# Patient Record
Sex: Male | Born: 1955 | Race: White | Marital: Married
Health system: Southern US, Community
[De-identification: ages and names within clinical notes are randomized; demographics above are authoritative.]

---

## 2018-03-24 ENCOUNTER — Telehealth: Payer: Self-pay | Admitting: Nurse Practitioner

## 2018-03-24 ENCOUNTER — Other Ambulatory Visit: Payer: Self-pay | Admitting: Rehabilitation

## 2018-03-24 DIAGNOSIS — M4322 Fusion of spine, cervical region: Secondary | ICD-10-CM

## 2018-03-24 DIAGNOSIS — M545 Low back pain: Secondary | ICD-10-CM

## 2018-03-24 DIAGNOSIS — G8929 Other chronic pain: Secondary | ICD-10-CM

## 2018-03-24 NOTE — Telephone Encounter (Signed)
Phone call to patient to verify medication list and allergies for myelogram procedure. Pt instructed to stop taking tramadol 48 hrs prior to myelogram appointment time. Pt verbalized understanding.

## 2018-04-07 ENCOUNTER — Ambulatory Visit
Admission: RE | Admit: 2018-04-07 | Discharge: 2018-04-07 | Disposition: A | Discharge status: Home / Self Care | Payer: BLUE CROSS/BLUE SHIELD | Source: Ambulatory Visit | Attending: Rehabilitation | Admitting: Rehabilitation

## 2018-04-07 DIAGNOSIS — M4322 Fusion of spine, cervical region: Secondary | ICD-10-CM

## 2018-04-07 DIAGNOSIS — M545 Low back pain: Secondary | ICD-10-CM

## 2018-04-07 DIAGNOSIS — G8929 Other chronic pain: Secondary | ICD-10-CM

## 2018-04-07 MED ORDER — DIAZEPAM 5 MG PO TABS
10.0000 mg | ORAL_TABLET | Freq: Once | ORAL | Status: AC
  Administered 2018-04-07: 10 mg via ORAL

## 2018-04-07 MED ORDER — IOPAMIDOL (ISOVUE-M 300) INJECTION 61%
10.0000 mL | Freq: Once | INTRAMUSCULAR | Status: AC | PRN
  Administered 2018-04-07: 10 mL via INTRATHECAL

## 2018-04-07 MED ORDER — ONDANSETRON HCL 4 MG/2ML IJ SOLN: 4.0000 mg | Freq: Four times a day (QID) | INTRAMUSCULAR | Status: DC | PRN

## 2018-04-07 NOTE — Discharge Instructions (Signed)
Myelogram Discharge Instructions  1. Go home and rest quietly for the next 24 hours.  It is important to lie flat for the next 24 hours.  Get up only to go to the restroom.  You may lie in the bed or on a couch on your back, your stomach, your left side or your right side.  You may have one pillow under your head.  You may have pillows between your knees while you are on your side or under your knees while you are on your back.  2. DO NOT drive today.  Recline the seat as far back as it will go, while still wearing your seat belt, on the way home.  3. You may get up to go to the bathroom as needed.  You may sit up for 10 minutes to eat.  You may resume your normal diet and medications unless otherwise indicated.  Drink lots of extra fluids today and tomorrow.  4. The incidence of headache, nausea, or vomiting is about 5% (one in 20 patients).  If you develop a headache, lie flat and drink plenty of fluids until the headache goes away.  Caffeinated beverages may be helpful.  If you develop severe nausea and vomiting or a headache that does not go away with flat bed rest, call [phone].  5. You may resume normal activities after your 24 hours of bed rest is over; however, do not exert yourself strongly or do any heavy lifting tomorrow. If when you get up you have a headache when standing, go back to bed and force fluids for another 24 hours.  6. Call your physician for a follow-up appointment.  The results of your myelogram will be sent directly to your physician by the following day.  7. If you have any questions or if complications develop after you arrive home, please call [phone].  Discharge instructions have been explained to the patient.  The patient, or the person responsible for the patient, fully understands these instructions.   YOU MAY RESTART YOUR FLEXERIL AND TRAMADOL TOMORROW 04/08/2018 AT 09:30AM.

## 2018-12-26 IMAGING — CT CT CERVICAL SPINE W/ CM
3 series · 8 of 14 positions shown, 9 images · non-contrast
Comparison: none

CLINICAL DATA: Bilateral leg weakness and numbness. Bilateral arm
weakness with muscle wasting.
TECHNIQUE: Contiguous axial images were obtained through the Cervical and
Lumbar spine after the intrathecal infusion of infusion. Coronal and
sagittal reconstructions were obtained of the axial image sets.

[Series 3: cspine soft · axial · 0.35mm/px · z∈[-224,-138]mm · 2 of 131 slices shown]
[im 44/131  soft-tissue]
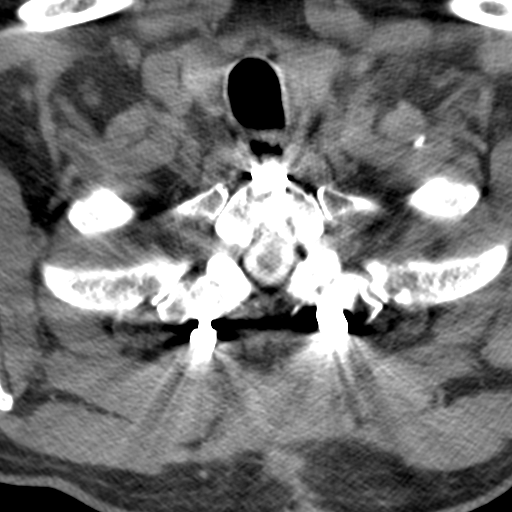
[im 87/131  soft-tissue]
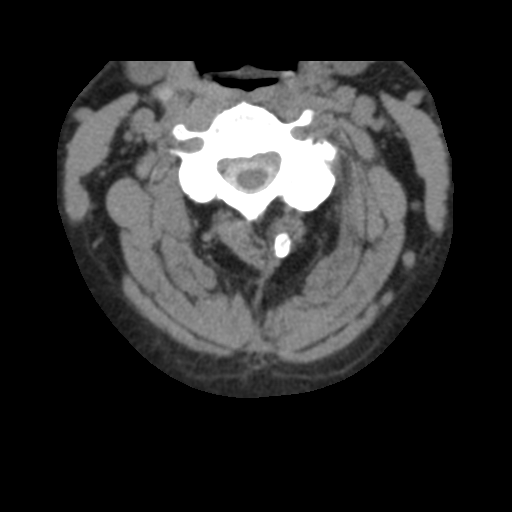

[Series 4: cspine soft (person_name) · axial · 0.35mm/px · z∈[-246,-116]mm · 3 of 131 slices shown]
[im 33/131  soft-tissue]
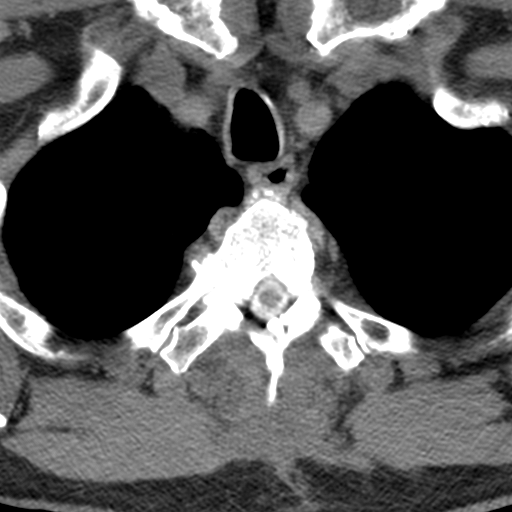
[im 66/131  soft-tissue]
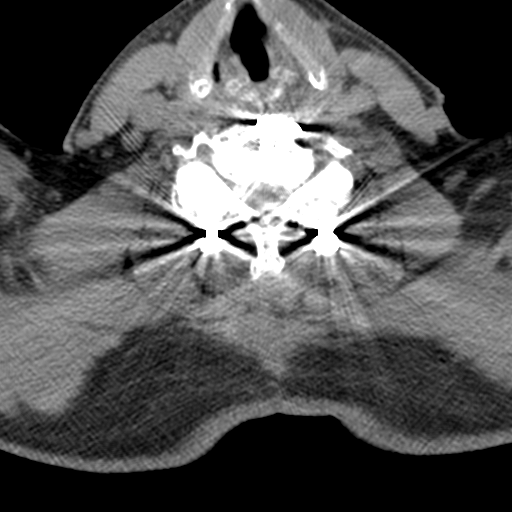
[im 98/131  soft-tissue]
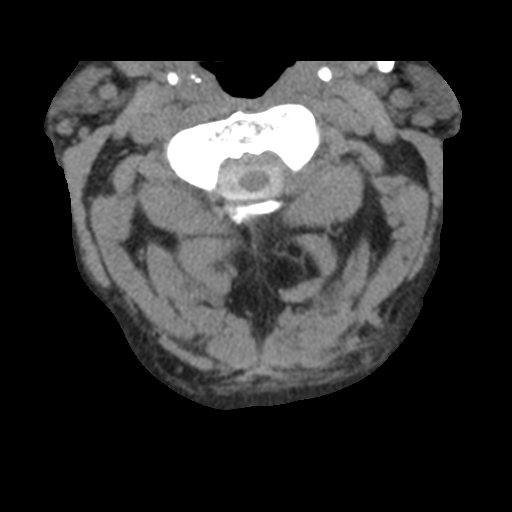

[Series 10: angled axial · axial · 0.29mm/px · z∈[-264,-135]mm · 3 of 132 slices shown, 4 images]
[im 33/132  soft-tissue]
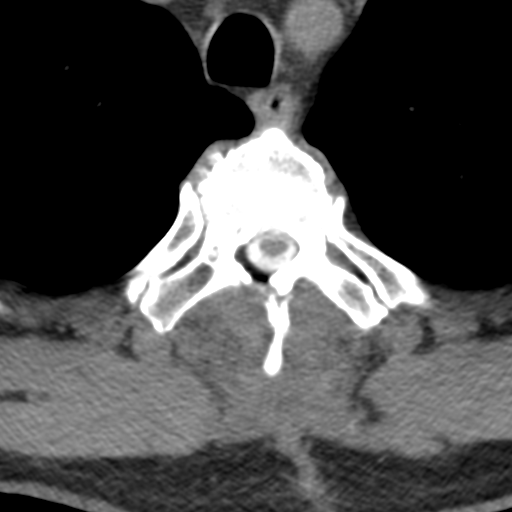
[im 33/132  bone]
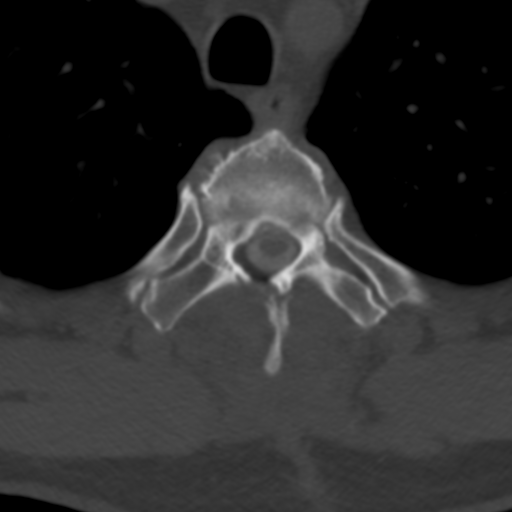
[im 66/132  bone]
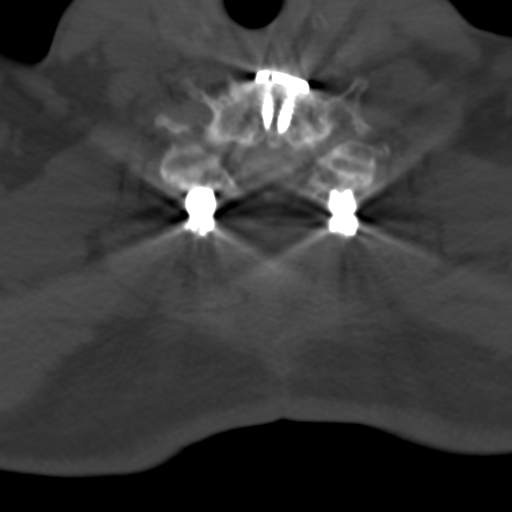
[im 99/132  bone]
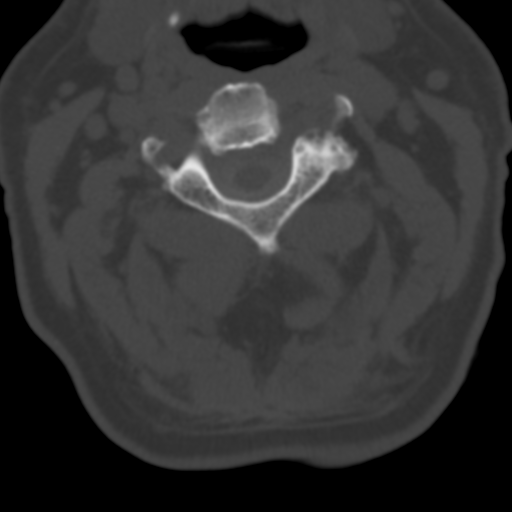

[8 of 14 positions shown; findings below may reference images not displayed]

FLUOROSCOPY TIME:  1 minutes 44 seconds. 577.80 micro gray meter
squared

PROCEDURE:
LUMBAR PUNCTURE FOR CERVICAL AND LUMBAR MYELOGRAM

CERVICAL AND LUMBAR MYELOGRAM

CT CERVICAL MYELOGRAM

CT LUMBAR MYELOGRAM

After thorough discussion of risks and benefits of the procedure
including bleeding, infection, injury to nerves, blood vessels,
adjacent structures as well as headache and CSF leak, written and
oral informed consent was obtained. Consent was obtained by Dr. Franca
Lenis.

Patient was positioned prone on the fluoroscopy table. Local
anesthesia was provided with 1% lidocaine without epinephrine after
prepped and draped in the usual sterile fashion. Puncture was
performed at L1-2 using a 3 1/2 inch 22-gauge spinal needle via
right para median approach. Using a single pass through the dura,
the needle was placed within the thecal sac, with return of clear
CSF. 10 mL of Isovue B-LAA was injected into the thecal sac, with
normal opacification of the nerve roots and cauda equina consistent
with free flow within the subarachnoid space. The patient was then
moved to the trendelenburg position and contrast flowed into the
Cervical spine region. Because of the severe stenosis at L1-2,
cervical opacity is low. We tried multiple different positions and
could not get high density contrast into the cervical region.

I personally performed the lumbar puncture and administered the
intrathecal contrast. I also personally performed acquisition of the
myelogram images.
FINDINGS: CERVICAL AND LUMBAR MYELOGRAM FINDINGS:

Lumbar region: Previous decompression and fusion from L2 to the
sacrum. Wide patency of the canal throughout that region. No
evidence of focal nerve root compression. No evidence of hardware
complication. There is severe stenosis at the L1-2 level which
offers a relative block to the passage of contrast. T12-L1 shows
disc degeneration with a small anterior extradural defect. Standing
lumbar flexion extension views show retrolisthesis at L1-2 of 5 mm
that reduces with flexion.

Cervical region: Previous ACDF C3-4. Previous ACDF C4 to C7.
Previous posterior fusion C5 to T2. Limited contrast opacity. No
apparent canal stenosis. No movement demonstrated in the fusion
region.

CT CERVICAL MYELOGRAM FINDINGS:

Foramen magnum is widely patent. Ordinary mild osteoarthritis at the
C1-2 articulation but without encroachment upon the neural spaces.

C2-3: Mild bilateral uncovertebral prominence. Facet osteoarthritis
on the left which could be a cause of mechanical neck pain. No canal
or foraminal stenosis.

C3-4: Previous ACDF. There appears to be solid union without
evidence of hardware loosening or complication. There is facet
arthropathy worse on the left than the right, with bilateral
foraminal narrowing due to osteophytic encroachment.

C4-C7: Previous ACDF appears solid without hardware complication. No
central canal stenosis. Foraminal encroachment by osteophytes at
C4-5, C5-6 and C6-7. Posterior fusion beginning at C5 and extending
down through T2. No evidence of screw malposition or loosening.

C7-T1: Discectomy with disc space device which appears well
positioned. Posterior fusion with good appearance. Wide patency of
the canal and foramina.

T1-2: Posterior fusion without hardware loosening. Wide patency of
the canal and foramina.

T2-3: Degenerative spondylosis with endplate osteophytes. Bilateral
foraminal narrowing right worse than left which could cause neural
compression.

T3-4: Congenital failure of separation at this level. Wide patency
of the canal and foramina.

T4-5: Bulging of the disc without apparent compressive stenosis.

CT LUMBAR MYELOGRAM FINDINGS:

T9-10: Mild bulging of the disc. Mild facet arthritis. No
compressive stenosis.

T10-11: Endplate osteophytes and bulging of the disc. Narrowing of
the subarachnoid space but no compression of the cord. Mild facet
osteoarthritis.

T11-12: Mild bulging of the disc.  Mild facet osteoarthritis.

T12-L1: Endplate osteophytes and bulging of the disc. Mild canal
stenosis with mild narrowing of the subarachnoid space surrounding
the distal cord and nerve roots but without distinct neural
compression.

L1-2: Severe multifactorial spinal stenosis. Retrolisthesis of 5 mm.
Endplate osteophytes. Disc degeneration with vacuum phenomenon. Some
bulging disc material. Facet and ligamentous hypertrophy. Neural
compression likely at this level. There is also bilateral foraminal
stenosis left worse than right.

L2-3: Solid posterior fusion. Posterior decompression. No canal or
foraminal stenosis.

L3-4: Solid posterior fusion. Posterior decompression. No canal or
foraminal stenosis.

L4-5: Solid anterior and posterior fusion. Posterior decompression.
Canal and foramina widely patent.

L5-S1: Solid anterior and posterior fusion. Posterior decompression.
Canal and foramina sufficiently patent.
IMPRESSION: Lumbar region: Solid fusion from L2 to the sacrum with wide patency
of the canal and foramina. Severe adjacent segment degenerative
disease at L1-2 with severe multifactorial spinal stenosis likely to
be symptomatic. Lesser degenerative changes at T12-L1 with canal
narrowing but no likely compressive stenosis at this time. Lesser
non-compressive degenerative changes at T9-10, T10-11 and T11-12.

Cervical region: C2-3 facet arthropathy on the left which could be a
cause of mechanical neck pain. No compressive canal or foraminal
stenosis at that level.

C3-4 ACDF appears solid. Pronounced facet arthropathy left worse
than right. Bilateral foraminal stenosis that could be symptomatic.

C4 through C7 ACDF appears solid. Sufficient canal patency.
Posterior fusion from C5-T2 without evidence of motion or hardware
loosening. Previous disc space fusion device at C7-T1 without
apparent complication. Spinal canal appears widely patent through
that region, but there is bony foraminal stenosis at C4-5, C5-6 and
C6-7 that could cause neural compression.

T2-3 degenerative spondylosis with bilateral foraminal narrowing
right worse than left. Congenital failure of separation at T3-4.

## 2022-07-05 ENCOUNTER — Telehealth: Payer: Self-pay | Admitting: *Deleted

## 2022-07-05 NOTE — Telephone Encounter (Signed)
Charles Chapman,   This pt is a documented difficult intubation and his procedure will need to be done at the hospital.    Thanks,  Jaidin Richison 

## 2022-07-08 NOTE — Telephone Encounter (Signed)
Dr Chales Abrahams,   Please review and advice on scheduling Colonoscopy at Hospital vs. Coming in for an office visit due to documented difficult intubation.

## 2022-07-11 NOTE — Telephone Encounter (Addendum)
Per Dr Chales Abrahams, pt can be direct colonoscopy at [facility].  Charles Chapman could you please set this up for the patient?  Called pt and explained that his procedure would have to be done at the hospital d/t difficult airway/intubation and that Dr. Urban Gibson nurse would call him back to schedule this.  He verbalized understanding.  Will cancel pts PV for 11/17 and procedure at [facility].

## 2022-07-12 NOTE — Telephone Encounter (Signed)
Pt was notified that we will place pt on our wait list and contact him when Dr. Chales Abrahams has availability: Pt verbalized understanding with all questions answered.

## 2022-07-15 ENCOUNTER — Telehealth: Payer: Self-pay

## 2022-07-15 NOTE — Telephone Encounter (Unsigned)
Left message with Pt wife for pt to give [place] a call back to discuss scheduling the Colonoscopy with Dr. Chales Abrahams at [facility]:

## 2022-07-16 NOTE — Telephone Encounter (Signed)
Pt was contacted to set up a colonoscopy at [facility] with Dr. Chales Abrahams: Pt stated that he wanted to hold off for now and will contact our office when he is ready:  Pt verbalized understanding with all questions answered.

## 2022-08-02 ENCOUNTER — Encounter: Payer: BLUE CROSS/BLUE SHIELD | Admitting: Gastroenterology
